# Patient Record
Sex: Female | Born: 1990 | Race: White | Hispanic: No | Marital: Single | Smoking: Current every day smoker
Health system: Southern US, Community
[De-identification: ages and names within clinical notes are randomized; demographics above are authoritative.]

## PROBLEM LIST (undated history)

## (undated) DIAGNOSIS — J9819 Other pulmonary collapse: Secondary | ICD-10-CM

## (undated) DIAGNOSIS — N390 Urinary tract infection, site not specified: Secondary | ICD-10-CM

## (undated) DIAGNOSIS — F1910 Other psychoactive substance abuse, uncomplicated: Secondary | ICD-10-CM

## (undated) DIAGNOSIS — Z8619 Personal history of other infectious and parasitic diseases: Secondary | ICD-10-CM

## (undated) DIAGNOSIS — Z639 Problem related to primary support group, unspecified: Secondary | ICD-10-CM

## (undated) HISTORY — PX: FOOT SURGERY: SHX648

## (undated) HISTORY — DX: Personal history of other infectious and parasitic diseases: Z86.19

## (undated) HISTORY — DX: Urinary tract infection, site not specified: N39.0

---
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
## 2012-06-12 ENCOUNTER — Encounter (HOSPITAL_BASED_OUTPATIENT_CLINIC_OR_DEPARTMENT_OTHER): Payer: Self-pay | Admitting: *Deleted

## 2012-06-12 ENCOUNTER — Emergency Department (HOSPITAL_BASED_OUTPATIENT_CLINIC_OR_DEPARTMENT_OTHER)
Admission: EM | Admit: 2012-06-12 | Discharge: 2012-06-12 | Disposition: A | Discharge status: Home / Self Care | Attending: Emergency Medicine | Admitting: Emergency Medicine

## 2012-06-12 DIAGNOSIS — L0231 Cutaneous abscess of buttock: Secondary | ICD-10-CM | POA: Insufficient documentation

## 2012-06-12 DIAGNOSIS — L0291 Cutaneous abscess, unspecified: Secondary | ICD-10-CM

## 2012-06-12 DIAGNOSIS — F17200 Nicotine dependence, unspecified, uncomplicated: Secondary | ICD-10-CM | POA: Insufficient documentation

## 2012-06-12 MED ORDER — OXYCODONE-ACETAMINOPHEN 5-325 MG PO TABS: 1.0000 | ORAL_TABLET | Freq: Four times a day (QID) | ORAL | Status: DC | PRN

## 2012-06-12 MED ORDER — OXYCODONE-ACETAMINOPHEN 5-325 MG PO TABS
ORAL_TABLET | ORAL | Status: AC
  Administered 2012-06-12: 1 via ORAL
  Filled 2012-06-12: qty 1

## 2012-06-12 MED ORDER — OXYCODONE-ACETAMINOPHEN 5-325 MG PO TABS
1.0000 | ORAL_TABLET | Freq: Once | ORAL | Status: AC
  Administered 2012-06-12: 1 via ORAL
  Filled 2012-06-12: qty 1

## 2012-06-12 MED ORDER — LIDOCAINE-EPINEPHRINE 2 %-1:100000 IJ SOLN
20.0000 mL | Freq: Once | INTRAMUSCULAR | Status: AC
  Administered 2012-06-12: 1 mL via INTRADERMAL

## 2012-06-12 MED ORDER — CLINDAMYCIN HCL 150 MG PO CAPS: 150.0000 mg | ORAL_CAPSULE | Freq: Four times a day (QID) | ORAL | Status: DC

## 2012-06-12 MED ORDER — LIDOCAINE-EPINEPHRINE 2 %-1:100000 IJ SOLN
INTRAMUSCULAR | Status: AC
  Administered 2012-06-12: 1 mL via INTRADERMAL
  Filled 2012-06-12: qty 1

## 2012-06-12 NOTE — ED Provider Notes (Addendum)
History     CSN: [id number]  Arrival date & time 06/12/12  2009   First MD Initiated Contact with Patient 06/12/12 2153      Chief Complaint  Patient presents with  . Abscess    (Consider location/radiation/quality/duration/timing/severity/associated sxs/prior treatment) HPI Pt presents with c/o buttock abscess.  She noted a painful area approx 2 days ago that has been becoming larger and more painful.  Pain is worse with sitting.  She tried to lance the area herself with a sewing needle and blood drained out.  No pain with bowel movements.  No fever, no vomiting.  Has not had hx of abscess.  Has noted small red areas on her buttocks in several other areas as well.  Nothing seems to make pain better.   There are no other associated systemic symptoms, there are no other alleviating or modifying factors.   Past Medical History  Diagnosis Date  . Urinary tract infection, site not specified   . History of chicken pox   . Asthma   . Collapsed lung   . PP care - s/p SVD 1/20 08/13/2011  . Family dysfunction 08/13/2011    Past Surgical History  Procedure Date  . Foot surgery     Family History  Problem Relation Age of Onset  . Diabetes Maternal Grandmother   . Hypertension Maternal Grandmother     History  Substance Use Topics  . Smoking status: Current Every Day Smoker -- 0.2 packs/day for 4 years    Types: Cigarettes  . Smokeless tobacco: Never Used  . Alcohol Use: No    OB History    Grav Para Term Preterm Abortions TAB SAB Ect Mult Living   1 1 1       1       Review of Systems ROS reviewed and all otherwise negative except for mentioned in HPI  Allergies  Review of patient's allergies indicates no known allergies.  Home Medications   Current Outpatient Rx  Name  Route  Sig  Dispense  Refill  . CLINDAMYCIN HCL 150 MG PO CAPS   Oral   Take 1 capsule (150 mg total) by mouth every 6 (six) hours.   28 capsule   0   . OXYCODONE-ACETAMINOPHEN 5-325 MG PO  TABS   Oral   Take 1-2 tablets by mouth every 6 (six) hours as needed for pain.   15 tablet   0   . PRENATAL MULTIVITAMIN CH   Oral   Take 1 tablet by mouth daily.           BP 124/77  Pulse 87  Temp 98.7 F (37.1 C) (Oral)  Resp 20  SpO2 100%  LMP 05/23/2012 Vitals reviewed Physical Exam Physical Examination: General appearance - alert, well appearing, and in no distress Mental status - alert, oriented to person, place, and time Chest - clear to auscultation, no wheezes, rales or rhonchi, symmetric air entry Heart - normal rate, regular rhythm, normal S1, S2, no murmurs, rubs, clicks or gallops Rectal - normal rectal, no masses Extremities - peripheral pulses normal, no pedal edema, no clubbing or cyanosis Skin - normal coloration and turgor, approx 3cm area of induration and tenderness on upper right buttock near gluteal cleft, mild overlying erythema  ED Course  Procedures (including critical care time)  INCISION AND DRAINAGE Performed by: Ethelda Chick Consent: Verbal consent obtained. Risks and benefits: risks, benefits and alternatives were discussed Type: abscess  Body area: right buttoc  Anesthesia: local infiltration  Incision was made with a scalpel.  Local anesthetic: lidocaine 2% w epinephrine  Anesthetic total: 5 ml  Complexity: complex Blunt dissection to break up loculations with hemostat  Drainage: purulent  Drainage amount: scant  Packing material: 1/4 in iodoform gauze  Patient tolerance: Patient tolerated the procedure well with no immediate complications.     Labs Reviewed - No data to display No results found.   1. Abscess       MDM  Pt presenting with buttock abscess- close to gluteal cleft but somewhat off midline, may represent pilonidal abscess.  Scant drainage on I and D but large pocket in subcutanous tissue.  Packing placed and abx started due to concern for cellulitis of surrounding tissue.  Discharged with  strict return precautions.  Pt agreeable with plan.        Ethelda Chick, MD 06/12/12 1610  Ethelda Chick, MD 06/24/12 [phone]

## 2012-06-12 NOTE — ED Notes (Signed)
MD at bedside. 

## 2012-06-12 NOTE — ED Notes (Signed)
Abscess to her buttocks since yesterday.

## 2012-06-16 ENCOUNTER — Emergency Department (HOSPITAL_BASED_OUTPATIENT_CLINIC_OR_DEPARTMENT_OTHER)
Admission: EM | Admit: 2012-06-16 | Discharge: 2012-06-16 | Disposition: A | Discharge status: Home / Self Care | Attending: Emergency Medicine | Admitting: Emergency Medicine

## 2012-06-16 ENCOUNTER — Encounter (HOSPITAL_BASED_OUTPATIENT_CLINIC_OR_DEPARTMENT_OTHER): Payer: Self-pay | Admitting: *Deleted

## 2012-06-16 DIAGNOSIS — Z8619 Personal history of other infectious and parasitic diseases: Secondary | ICD-10-CM | POA: Insufficient documentation

## 2012-06-16 DIAGNOSIS — J45909 Unspecified asthma, uncomplicated: Secondary | ICD-10-CM | POA: Insufficient documentation

## 2012-06-16 DIAGNOSIS — Z8709 Personal history of other diseases of the respiratory system: Secondary | ICD-10-CM | POA: Insufficient documentation

## 2012-06-16 DIAGNOSIS — Z5189 Encounter for other specified aftercare: Secondary | ICD-10-CM

## 2012-06-16 DIAGNOSIS — F17200 Nicotine dependence, unspecified, uncomplicated: Secondary | ICD-10-CM | POA: Insufficient documentation

## 2012-06-16 DIAGNOSIS — Z48817 Encounter for surgical aftercare following surgery on the skin and subcutaneous tissue: Secondary | ICD-10-CM | POA: Insufficient documentation

## 2012-06-16 DIAGNOSIS — Z87440 Personal history of urinary (tract) infections: Secondary | ICD-10-CM | POA: Insufficient documentation

## 2012-06-16 NOTE — ED Provider Notes (Signed)
History  This chart was scribed for Glynn Octave, MD by Ardeen Jourdain, ED Scribe. This patient was seen in room MH12/MH12 and the patient's care was started at 1632.  CSN: [id number]  Arrival date & time 06/16/12  1624   First MD Initiated Contact with Patient 06/16/12 1632      Chief Complaint  Patient presents with  . Wound Check     The history is provided by the patient. No language interpreter was used.    Alicia Ellis is a 21 y.o. female who presents to the Emergency Department complaining of needing an abscess rechecked. She states that the abscess was drained here at [facility] the [facility]. She reports the packing fell out 2 days ago. She states the area is sore, itchy and painful at times. She denies fever, nausea, emesis, abnormal drainage from the site and abdominal pain as associated symptoms. She denies any history of these conditions. She has a h/o UTI, asthma and collapsed lung. She is a current everyday smoker but denies alcohol use.   Past Medical History  Diagnosis Date  . Urinary tract infection, site not specified   . History of chicken pox   . Asthma   . Collapsed lung   . PP care - s/p SVD 1/20 08/13/2011  . Family dysfunction 08/13/2011    Past Surgical History  Procedure Date  . Foot surgery     Family History  Problem Relation Age of Onset  . Diabetes Maternal Grandmother   . Hypertension Maternal Grandmother     History  Substance Use Topics  . Smoking status: Current Every Day Smoker -- 0.2 packs/day for 4 years    Types: Cigarettes  . Smokeless tobacco: Never Used  . Alcohol Use: No    OB History    Grav Para Term Preterm Abortions TAB SAB Ect Mult Living   1 1 1       1       Review of Systems  All other systems reviewed and are negative.  A complete 10 system review of systems was obtained and all systems are negative except as noted in the HPI and PMH.   Allergies  Review of patient's allergies indicates no known allergies.  Home  Medications   Current Outpatient Rx  Name  Route  Sig  Dispense  Refill  . CLINDAMYCIN HCL 150 MG PO CAPS   Oral   Take 1 capsule (150 mg total) by mouth every 6 (six) hours.   28 capsule   0   . OXYCODONE-ACETAMINOPHEN 5-325 MG PO TABS   Oral   Take 1-2 tablets by mouth every 6 (six) hours as needed for pain.   15 tablet   0   . PRENATAL MULTIVITAMIN CH   Oral   Take 1 tablet by mouth daily.           Triage Vitals: BP 117/77  Pulse 108  Temp 97.9 F (36.6 C) (Oral)  Resp 18  Ht 5\' 3"  (1.6 m)  Wt 135 lb (61.236 kg)  BMI 23.91 kg/m2  SpO2 100%  LMP 05/23/2012  Physical Exam  Nursing note and vitals reviewed. Constitutional: She is oriented to person, place, and time. She appears well-developed and well-nourished. No distress.  HENT:  Head: Normocephalic and atraumatic.  Eyes: EOM are normal. Pupils are equal, round, and reactive to light.  Neck: Normal range of motion. Neck supple. No tracheal deviation present.  Cardiovascular: Normal rate, regular rhythm and normal heart sounds.  Pulmonary/Chest: Effort normal and breath sounds normal. No respiratory distress.  Abdominal: Soft. She exhibits no distension.  Musculoskeletal: Normal range of motion. She exhibits no edema.  Neurological: She is alert and oriented to person, place, and time.  Skin: Skin is warm and dry.       Right buttock superior cleft open incision with surrounding induration of 2 cm. No drainage. No surrounding cellulitis   Psychiatric: She has a normal mood and affect. Her behavior is normal.    ED Course  INCISION AND DRAINAGE Date/Time: 06/16/2012 5:01 PM Performed by: Glynn Octave Authorized by: Glynn Octave Consent: Verbal consent obtained. Risks and benefits: risks, benefits and alternatives were discussed Consent given by: patient Patient understanding: patient states understanding of the procedure being performed Patient identity confirmed: verbally with patient Type:  pilonidal cyst Body area: anogenital Location details: pilonidal Anesthesia: local infiltration Local anesthetic: lidocaine 1% without epinephrine Anesthetic total: 5 ml Patient sedated: no Scalpel size: 11 Incision type: single straight Complexity: complex Drainage: bloody Drainage amount: scant Wound treatment: wound left open Packing material: 1/4 in iodoform gauze Patient tolerance: Patient tolerated the procedure well with no immediate complications.   (including critical care time)  DIAGNOSTIC STUDIES: Oxygen Saturation is 100% on normal,  by my interpretation.    COORDINATION OF CARE:  4:47 PM: Discussed treatment plan which includes an I&D with pt at bedside and pt agreed to plan.    Labs Reviewed - No data to display No results found.   No diagnosis found.    MDM  Recheck of R buttock abscess.  Open incision with surrounding induration. No fluctuance or cellulitis. No fever or vomiting.  Chaperone Tresa Endo present for exam and procedure. Wound reexplored.  No further purulence.  Patient states induration is improving.  Continue antibiotics. Follow up with PCP. May need evaluation for pilonidal cyst when infection clears.    I personally performed the services described in this documentation, which was scribed in my presence. The recorded information has been reviewed and is accurate.    Glynn Octave, MD 06/16/12 1714

## 2012-06-16 NOTE — ED Notes (Addendum)
[facility] here looking for patient. Pt was discharged prior to [facility] arrival. [facility] requests that if patient checks in again to be called immediately.

## 2012-06-16 NOTE — ED Notes (Signed)
Pt last seen 21st  , returned today for abscess recheck , pt states packing came out x 2 days ago

## 2012-09-11 ENCOUNTER — Emergency Department (HOSPITAL_BASED_OUTPATIENT_CLINIC_OR_DEPARTMENT_OTHER)
Admission: EM | Admit: 2012-09-11 | Discharge: 2012-09-11 | Disposition: A | Discharge status: Home / Self Care | Attending: Emergency Medicine | Admitting: Emergency Medicine

## 2012-09-11 ENCOUNTER — Encounter (HOSPITAL_BASED_OUTPATIENT_CLINIC_OR_DEPARTMENT_OTHER): Payer: Self-pay | Admitting: *Deleted

## 2012-09-11 DIAGNOSIS — Z639 Problem related to primary support group, unspecified: Secondary | ICD-10-CM | POA: Insufficient documentation

## 2012-09-11 DIAGNOSIS — Z8619 Personal history of other infectious and parasitic diseases: Secondary | ICD-10-CM | POA: Insufficient documentation

## 2012-09-11 DIAGNOSIS — Z87440 Personal history of urinary (tract) infections: Secondary | ICD-10-CM | POA: Insufficient documentation

## 2012-09-11 DIAGNOSIS — N764 Abscess of vulva: Secondary | ICD-10-CM | POA: Insufficient documentation

## 2012-09-11 DIAGNOSIS — Z8709 Personal history of other diseases of the respiratory system: Secondary | ICD-10-CM | POA: Insufficient documentation

## 2012-09-11 DIAGNOSIS — L0291 Cutaneous abscess, unspecified: Secondary | ICD-10-CM

## 2012-09-11 DIAGNOSIS — F17200 Nicotine dependence, unspecified, uncomplicated: Secondary | ICD-10-CM | POA: Insufficient documentation

## 2012-09-11 DIAGNOSIS — J45909 Unspecified asthma, uncomplicated: Secondary | ICD-10-CM | POA: Insufficient documentation

## 2012-09-11 NOTE — ED Notes (Signed)
States she has an abcess on her vagina

## 2012-09-11 NOTE — ED Notes (Signed)
MD at bedside. 

## 2012-09-11 NOTE — ED Provider Notes (Signed)
History     CSN: [id number]  Arrival date & time 09/11/12  3086   First MD Initiated Contact with Patient 09/11/12 [phone]      Chief Complaint  Patient presents with  . Recurrent Skin Infections    (Consider location/radiation/quality/duration/timing/severity/associated sxs/prior treatment) HPI Alicia Ellis is a 22 y.o. female presenting with an abscess to her mons pubis. Patient says this started as a small pimple and has grown over the course of the last 2-3 days has become more and more painful. Pain is currently severe when touched or when any thing brushes the area. She says there is no surrounding area of redness and she's had no fevers, chills, abdominal pain, vaginal discharge, dysuria, frequency, chest pain or shortness of breath. She has tried no remedies.   Past Medical History  Diagnosis Date  . Urinary tract infection, site not specified   . History of chicken pox   . Asthma   . Collapsed lung   . PP care - s/p SVD 1/20 08/13/2011  . Family dysfunction 08/13/2011    Past Surgical History  Procedure Laterality Date  . Foot surgery      Family History  Problem Relation Age of Onset  . Diabetes Maternal Grandmother   . Hypertension Maternal Grandmother     History  Substance Use Topics  . Smoking status: Current Every Day Smoker -- 0.25 packs/day for 4 years    Types: Cigarettes  . Smokeless tobacco: Never Used  . Alcohol Use: No    OB History   Grav Para Term Preterm Abortions TAB SAB Ect Mult Living   1 1 1       1       Review of Systems At least 10pt or greater review of systems completed and are negative except where specified in the HPI.  Allergies  Review of patient's allergies indicates no known allergies.  Home Medications   Current Outpatient Rx  Name  Route  Sig  Dispense  Refill  . clindamycin (CLEOCIN) 150 MG capsule   Oral   Take 1 capsule (150 mg total) by mouth every 6 (six) hours.   28 capsule   0   . oxyCODONE-acetaminophen  (PERCOCET/ROXICET) 5-325 MG per tablet   Oral   Take 1-2 tablets by mouth every 6 (six) hours as needed for pain.   15 tablet   0   . Prenatal Vit-Fe Fumarate-FA (PRENATAL MULTIVITAMIN) TABS   Oral   Take 1 tablet by mouth daily.           BP 119/58  Pulse 100  Temp(Src) 98.7 F (37.1 C) (Oral)  Resp 16  SpO2 100%  Physical Exam Genital exam and incision and drainage performed with female chaperone  Nursing notes reviewed.  Electronic medical record reviewed. VITAL SIGNS:   Filed Vitals:   09/11/12 0639  BP: 119/58  Pulse: 100  Temp: 98.7 F (37.1 C)  TempSrc: Oral  Resp: 16  SpO2: 100%   CONSTITUTIONAL: Awake, oriented x4, appears non-toxic, smells of cigarette smoke HENT: Atraumatic, normocephalic, oral mucosa pink and moist, airway patent. Nares patent without drainage. External ears normal. EYES: Conjunctiva clear, EOMI, PERRLA NECK: Trachea midline, non-tender, supple CARDIOVASCULAR: Normal heart rate, Normal rhythm, No murmurs, rubs, gallops PULMONARY/CHEST: Clear to auscultation, no rhonchi, wheezes, or rales. Symmetrical breath sounds. Non-tender. ABDOMINAL: Non-distended, soft, non-tender - no rebound or guarding.  BS normal. NEUROLOGIC: Non-focal, moving all four extremities, no gross sensory or motor deficits. EXTERNAL GENITAL: Normal female labia.  2cm area of erythema/induration with central fluctuance on left mons pubis. EXTREMITIES: No clubbing, cyanosis, or edema SKIN: Warm, Dry, No erythema, No rash  ED Course  INCISION AND DRAINAGE Date/Time: 09/11/2012 7:17 AM Performed by: Jones Skene Authorized by: Jones Skene Consent: Verbal consent obtained. Risks and benefits: risks, benefits and alternatives were discussed Consent given by: patient Patient identity confirmed: verbally with patient Type: abscess Body area: anogenital (mons pubis) Local anesthetic: lidocaine 2% with epinephrine Anesthetic total: 5 ml Patient sedated:  no Scalpel size: 11 Incision type: single straight Complexity: simple Drainage: purulent Drainage amount: moderate Wound treatment: wound left open Patient tolerance: Patient tolerated the procedure well with no immediate complications.   (including critical care time)  Labs Reviewed - No data to display No results found.   1. Abscess       MDM  Alicia Ellis is a 22 y.o. female presents a small abscess to the mons pubis. Area was I&D with good result, small loculations broken up with a curved mosquito, no immediate complications.  There is no surrounding erythema to suggest cellulitis, Robaxin not indicated at this time, patient is up-to-date on vaccinations.  I explained the diagnosis and have given explicit precautions to return to the ER including redness, swelling, draining pus, fever, chills, abdominal or any other new or worsening symptoms. The patient understands and accepts the medical plan as it's been dictated and I have answered their questions. Discharge instructions concerning home care and prescriptions have been given.  The patient is STABLE and is discharged to home in good condition.         Jones Skene, MD 09/11/12 [phone]

## 2013-02-02 ENCOUNTER — Encounter (HOSPITAL_BASED_OUTPATIENT_CLINIC_OR_DEPARTMENT_OTHER): Payer: Self-pay | Admitting: Emergency Medicine

## 2013-02-02 ENCOUNTER — Emergency Department (HOSPITAL_BASED_OUTPATIENT_CLINIC_OR_DEPARTMENT_OTHER)
Admission: EM | Admit: 2013-02-02 | Discharge: 2013-02-02 | Disposition: A | Discharge status: Home / Self Care | Attending: Emergency Medicine | Admitting: Emergency Medicine

## 2013-02-02 DIAGNOSIS — Z79899 Other long term (current) drug therapy: Secondary | ICD-10-CM | POA: Insufficient documentation

## 2013-02-02 DIAGNOSIS — Z8619 Personal history of other infectious and parasitic diseases: Secondary | ICD-10-CM | POA: Insufficient documentation

## 2013-02-02 DIAGNOSIS — F17200 Nicotine dependence, unspecified, uncomplicated: Secondary | ICD-10-CM | POA: Insufficient documentation

## 2013-02-02 DIAGNOSIS — Z3202 Encounter for pregnancy test, result negative: Secondary | ICD-10-CM | POA: Insufficient documentation

## 2013-02-02 DIAGNOSIS — F1910 Other psychoactive substance abuse, uncomplicated: Secondary | ICD-10-CM

## 2013-02-02 DIAGNOSIS — Z87440 Personal history of urinary (tract) infections: Secondary | ICD-10-CM | POA: Insufficient documentation

## 2013-02-02 DIAGNOSIS — F1210 Cannabis abuse, uncomplicated: Secondary | ICD-10-CM | POA: Insufficient documentation

## 2013-02-02 DIAGNOSIS — Z8709 Personal history of other diseases of the respiratory system: Secondary | ICD-10-CM | POA: Insufficient documentation

## 2013-02-02 DIAGNOSIS — J45909 Unspecified asthma, uncomplicated: Secondary | ICD-10-CM | POA: Insufficient documentation

## 2013-02-02 DIAGNOSIS — F1010 Alcohol abuse, uncomplicated: Secondary | ICD-10-CM | POA: Insufficient documentation

## 2013-02-02 LAB — RAPID URINE DRUG SCREEN, HOSP PERFORMED
Cocaine: NOT DETECTED
Opiates: NOT DETECTED

## 2013-02-02 LAB — COMPREHENSIVE METABOLIC PANEL
AST: 16 U/L (ref 0–37)
Albumin: 4 g/dL (ref 3.5–5.2)
BUN: 9 mg/dL (ref 6–23)
CO2: 23 mEq/L (ref 19–32)
Calcium: 9.7 mg/dL (ref 8.4–10.5)
Creatinine, Ser: 0.7 mg/dL (ref 0.50–1.10)
GFR calc non Af Amer: >90 mL/min (ref >90)
Total Bilirubin: 0.5 mg/dL (ref 0.3–1.2)

## 2013-02-02 LAB — PREGNANCY, URINE: Preg Test, Ur: NEGATIVE

## 2013-02-02 LAB — URINALYSIS, ROUTINE W REFLEX MICROSCOPIC
Hgb urine dipstick: NEGATIVE
Nitrite: NEGATIVE
Specific Gravity, Urine: 1.028 (ref 1.005–1.030)
Urobilinogen, UA: 1 mg/dL (ref 0.0–1.0)

## 2013-02-02 LAB — URINE MICROSCOPIC-ADD ON

## 2013-02-02 LAB — CBC
HCT: 37.6 % (ref 36.0–46.0)
MCH: 29.9 pg (ref 26.0–34.0)
MCV: 90.6 fL (ref 78.0–100.0)
Platelets: 222 10*3/uL (ref 150–400)
RDW: 14.9 % (ref 11.5–15.5)

## 2013-02-02 LAB — ETHANOL: Alcohol, Ethyl (B): <11 mg/dL (ref 0–11)

## 2013-02-02 NOTE — ED Provider Notes (Signed)
History    CSN: [id number] Arrival date & time 02/02/13  2107  First MD Initiated Contact with Patient 02/02/13 2137     Chief Complaint  Patient presents with  . Medical Clearance   (Consider location/radiation/quality/duration/timing/severity/associated sxs/prior Treatment) Patient is a 22 y.o. female presenting with drug/alcohol assessment. The history is provided by the patient. No language interpreter was used.  Drug / Alcohol Assessment Similar prior episodes: yes   Severity:  Severe Onset quality:  Unable to specify Timing:  Constant Progression:  Worsening Suspected agents:  Alcohol and marijuana Associated symptoms: no suicidal ideation   Risk factors: no withdrawal syndrome   Pt reports she smokes marijuana multiple times daily.  Pt reports she drinks as much alcohol as she can get.   Pt has used multiple other drugs but not recently.   Pt reports DSS told her she needs to be in a treatment program. For detox.   Pt has an assessment at [facility] on Wednesday for long term treatment.   Pt is not suicidal.   Pt is not homicidal.   Past Medical History  Diagnosis Date  . Urinary tract infection, site not specified   . History of chicken pox   . Asthma   . Collapsed lung   . PP care - s/p SVD 1/20 08/13/2011  . Family dysfunction 08/13/2011   Past Surgical History  Procedure Laterality Date  . Foot surgery     Family History  Problem Relation Age of Onset  . Diabetes Maternal Grandmother   . Hypertension Maternal Grandmother    History  Substance Use Topics  . Smoking status: Current Every Day Smoker -- 0.25 packs/day for 4 years    Types: Cigarettes  . Smokeless tobacco: Never Used  . Alcohol Use: 14.4 oz/week    24 Cans of beer per week     Comment: last drink this morning   OB History   Grav Para Term Preterm Abortions TAB SAB Ect Mult Living   1 1 1       1      Review of Systems  Psychiatric/Behavioral: Negative for suicidal ideas, self-injury and  dysphoric mood.  All other systems reviewed and are negative.    Allergies  Review of patient's allergies indicates no known allergies.  Home Medications   Current Outpatient Rx  Name  Route  Sig  Dispense  Refill  . clindamycin (CLEOCIN) 150 MG capsule   Oral   Take 1 capsule (150 mg total) by mouth every 6 (six) hours.   28 capsule   0   . oxyCODONE-acetaminophen (PERCOCET/ROXICET) 5-325 MG per tablet   Oral   Take 1-2 tablets by mouth every 6 (six) hours as needed for pain.   15 tablet   0   . Prenatal Vit-Fe Fumarate-FA (PRENATAL MULTIVITAMIN) TABS   Oral   Take 1 tablet by mouth daily.          BP 131/69  Pulse 81  Temp(Src) 99 F (37.2 C) (Oral)  Resp 18  Ht 5\' 3"  (1.6 m)  Wt 138 lb (62.596 kg)  BMI 24.45 kg/m2  SpO2 99% Physical Exam  Nursing note and vitals reviewed. Constitutional: She is oriented to person, place, and time. She appears well-developed and well-nourished.  HENT:  Head: Normocephalic.  Right Ear: External ear normal.  Left Ear: External ear normal.  Nose: Nose normal.  Mouth/Throat: Oropharynx is clear and moist.  Eyes: Conjunctivae and EOM are normal. Pupils are equal, round, and  reactive to light.  Neck: Normal range of motion. Neck supple.  Cardiovascular: Normal rate.   Pulmonary/Chest: Effort normal.  Abdominal: Soft.  Musculoskeletal: Normal range of motion.  Neurological: She is alert and oriented to person, place, and time. She has normal reflexes.  Skin: Skin is warm.  Psychiatric: She has a normal mood and affect.    ED Course  Procedures (including critical care time) Labs Reviewed  URINALYSIS, ROUTINE W REFLEX MICROSCOPIC - Abnormal; Notable for the following:    Color, Urine AMBER (*)    APPearance CLOUDY (*)    Ketones, ur 15 (*)    Protein, ur 100 (*)    Leukocytes, UA SMALL (*)    All other components within normal limits  URINE RAPID DRUG SCREEN (HOSP PERFORMED) - Abnormal; Notable for the following:     Tetrahydrocannabinol POSITIVE (*)    All other components within normal limits  URINE MICROSCOPIC-ADD ON - Abnormal; Notable for the following:    Squamous Epithelial / LPF MANY (*)    Bacteria, UA FEW (*)    All other components within normal limits  URINE CULTURE  PREGNANCY, URINE  CBC  COMPREHENSIVE METABOLIC PANEL  ETHANOL   No results found. 1. Substance abuse     MDM  Marijuana positive,   Labs sent to [facility].   Pt advised to keep appointment.  No signs of DT's  Pt advised to go to [facility] if any problems.  Lonia Skinner [place], PA-C 02/02/13 2324

## 2013-02-02 NOTE — ED Notes (Signed)
Results faxed to [facility] and paperwork and results given to pt in sealed envelope to take to [facility]

## 2013-02-02 NOTE — ED Notes (Signed)
Pt's ride here for transport home, PA made aware. NAD noted at this time, pt denies SI or HI.

## 2013-02-02 NOTE — ED Notes (Signed)
Pt seeking detox from marijuana and etoh.

## 2013-02-03 LAB — URINE CULTURE: Colony Count: 35000

## 2013-02-03 NOTE — ED Provider Notes (Signed)
Medical screening examination/treatment/procedure(s) were performed by non-physician practitioner and as supervising physician I was immediately available for consultation/collaboration.   Seleena Reimers, MD 02/03/13 0826 

## 2014-05-24 ENCOUNTER — Encounter (HOSPITAL_BASED_OUTPATIENT_CLINIC_OR_DEPARTMENT_OTHER): Payer: Self-pay | Admitting: Emergency Medicine

## 2014-06-02 ENCOUNTER — Emergency Department (HOSPITAL_BASED_OUTPATIENT_CLINIC_OR_DEPARTMENT_OTHER)
Admission: EM | Admit: 2014-06-02 | Discharge: 2014-06-02 | Disposition: A | Discharge status: Home / Self Care | Payer: BC Managed Care – PPO | Attending: Emergency Medicine | Admitting: Emergency Medicine

## 2014-06-02 ENCOUNTER — Encounter (HOSPITAL_BASED_OUTPATIENT_CLINIC_OR_DEPARTMENT_OTHER): Payer: Self-pay

## 2014-06-02 ENCOUNTER — Emergency Department (HOSPITAL_BASED_OUTPATIENT_CLINIC_OR_DEPARTMENT_OTHER): Payer: BC Managed Care – PPO

## 2014-06-02 DIAGNOSIS — Z8619 Personal history of other infectious and parasitic diseases: Secondary | ICD-10-CM | POA: Insufficient documentation

## 2014-06-02 DIAGNOSIS — Y9389 Activity, other specified: Secondary | ICD-10-CM | POA: Insufficient documentation

## 2014-06-02 DIAGNOSIS — Z720 Tobacco use: Secondary | ICD-10-CM | POA: Insufficient documentation

## 2014-06-02 DIAGNOSIS — Z87440 Personal history of urinary (tract) infections: Secondary | ICD-10-CM | POA: Diagnosis not present

## 2014-06-02 DIAGNOSIS — Y92009 Unspecified place in unspecified non-institutional (private) residence as the place of occurrence of the external cause: Secondary | ICD-10-CM | POA: Insufficient documentation

## 2014-06-02 DIAGNOSIS — S199XXA Unspecified injury of neck, initial encounter: Secondary | ICD-10-CM | POA: Diagnosis present

## 2014-06-02 DIAGNOSIS — Y998 Other external cause status: Secondary | ICD-10-CM | POA: Insufficient documentation

## 2014-06-02 DIAGNOSIS — J45909 Unspecified asthma, uncomplicated: Secondary | ICD-10-CM | POA: Insufficient documentation

## 2014-06-02 DIAGNOSIS — S161XXA Strain of muscle, fascia and tendon at neck level, initial encounter: Secondary | ICD-10-CM | POA: Insufficient documentation

## 2014-06-02 HISTORY — DX: Other psychoactive substance abuse, uncomplicated: F19.10

## 2014-06-02 MED ORDER — HYDROCODONE-ACETAMINOPHEN 5-325 MG PO TABS: 1.0000 | ORAL_TABLET | Freq: Once | ORAL | Status: DC

## 2014-06-02 MED ORDER — IBUPROFEN 400 MG PO TABS: 600.0000 mg | ORAL_TABLET | Freq: Once | ORAL | Status: DC

## 2014-06-02 MED ORDER — CYCLOBENZAPRINE HCL 10 MG PO TABS
10.0000 mg | ORAL_TABLET | Freq: Once | ORAL | Status: AC
  Administered 2014-06-02: 10 mg via ORAL
  Filled 2014-06-02: qty 1

## 2014-06-02 MED ORDER — KETOROLAC TROMETHAMINE 60 MG/2ML IM SOLN
60.0000 mg | Freq: Once | INTRAMUSCULAR | Status: AC
  Administered 2014-06-02: 60 mg via INTRAMUSCULAR
  Filled 2014-06-02: qty 2

## 2014-06-02 MED ORDER — CYCLOBENZAPRINE HCL 10 MG PO TABS: 10.0000 mg | ORAL_TABLET | Freq: Two times a day (BID) | ORAL | Status: AC | PRN

## 2014-06-02 NOTE — Discharge Instructions (Signed)
Assault, General Assault includes any behavior, whether intentional or reckless, which results in bodily injury to another person and/or damage to property. Included in this would be any behavior, intentional or reckless, that by its nature would be understood (interpreted) by a reasonable person as intent to harm another person or to damage his/her property. Threats may be oral or written. They may be communicated through regular mail, computer, fax, or phone. These threats may be direct or implied. FORMS OF ASSAULT INCLUDE:  Physically assaulting a person. This includes physical threats to inflict physical harm as well as:  Slapping.  Hitting.  Poking.  Kicking.  Punching.  Pushing.  Arson.  Sabotage.  Equipment vandalism.  Damaging or destroying property.  Throwing or hitting objects.  Displaying a weapon or an object that appears to be a weapon in a threatening manner.  Carrying a firearm of any kind.  Using a weapon to harm someone.  Using greater physical size/strength to intimidate another.  Making intimidating or threatening gestures.  Bullying.  Hazing.  Intimidating, threatening, hostile, or abusive language directed toward another person.  It communicates the intention to engage in violence against that person. And it leads a reasonable person to expect that violent behavior may occur.  Stalking another person. IF IT HAPPENS AGAIN:  Immediately call for emergency help (911 in [place]  If someone poses clear and immediate danger to you, seek legal authorities to have a protective or restraining order put in place.  Less threatening assaults can at least be reported to authorities. STEPS TO TAKE IF A SEXUAL ASSAULT HAS HAPPENED  Go to an area of safety. This may include a shelter or staying with a friend. Stay away from the area where you have been attacked. A large percentage of sexual assaults are caused by a friend, relative or associate.  If  medications were given by your caregiver, take them as directed for the full length of time prescribed.  Only take over-the-counter or prescription medicines for pain, discomfort, or fever as directed by your caregiver.  If you have come in contact with a sexual disease, find out if you are to be tested again. If your caregiver is concerned about the HIV/AIDS virus, he/she may require you to have continued testing for several months.  For the protection of your privacy, test results can not be given over the phone. Make sure you receive the results of your test. If your test results are not back during your visit, make an appointment with your caregiver to find out the results. Do not assume everything is normal if you have not heard from your caregiver or the medical facility. It is important for you to follow up on all of your test results.  File appropriate papers with authorities. This is important in all assaults, even if it has occurred in a family or by a friend. SEEK MEDICAL CARE IF:  You have new problems because of your injuries.  You have problems that may be because of the medicine you are taking, such as:  Rash.  Itching.  Swelling.  Trouble breathing.  You develop belly (abdominal) pain, feel sick to your stomach (nausea) or are vomiting.  You begin to run a temperature.  You need supportive care or referral to a rape crisis center. These are centers with trained personnel who can help you get through this ordeal. SEEK IMMEDIATE MEDICAL CARE IF:  You are afraid of being threatened, beaten, or abused. In [place] call 911.  You  receive new injuries related to abuse.  You develop severe pain in any area injured in the assault or have any change in your condition that concerns you.  You faint or lose consciousness.  You develop chest pain or shortness of breath. Document Released: 07/09/2005 Document Revised: 10/01/2011 Document Reviewed: 02/25/2008 [facility] Patient  Information 2015 [place], [place]. This information is not intended to replace advice given to you by your health care provider. Make sure you discuss any questions you have with your health care provider.  Cervical Sprain A cervical sprain is when the tissues (ligaments) that hold the neck bones in place stretch or tear. HOME CARE   Put ice on the injured area.  Put ice in a plastic bag.  Place a towel between your skin and the bag.  Leave the ice on for 15-20 minutes, 3-4 times a day.  You may have been given a collar to wear. This collar keeps your neck from moving while you heal.  Do not take the collar off unless told by your doctor.  If you have long hair, keep it outside of the collar.  Ask your doctor before changing the position of your collar. You may need to change its position over time to make it more comfortable.  If you are allowed to take off the collar for cleaning or bathing, follow your doctor's instructions on how to do it safely.  Keep your collar clean by wiping it with mild soap and water. Dry it completely. If the collar has removable pads, remove them every 1-2 days to hand wash them with soap and water. Allow them to air dry. They should be dry before you wear them in the collar.  Do not drive while wearing the collar.  Only take medicine as told by your doctor.  Keep all doctor visits as told.  Keep all physical therapy visits as told.  Adjust your work station so that you have good posture while you work.  Avoid positions and activities that make your problems worse.  Warm up and stretch before being active. GET HELP IF:  Your pain is not controlled with medicine.  You cannot take less pain medicine over time as planned.  Your activity level does not improve as expected. GET HELP RIGHT AWAY IF:   You are bleeding.  Your stomach is upset.  You have an allergic reaction to your medicine.  You develop new problems that you cannot  explain.  You lose feeling (become numb) or you cannot move any part of your body (paralysis).  You have tingling or weakness in any part of your body.  Your symptoms get worse. Symptoms include:  Pain, soreness, stiffness, puffiness (swelling), or a burning feeling in your neck.  Pain when your neck is touched.  Shoulder or upper back pain.  Limited ability to move your neck.  Headache.  Dizziness.  Your hands or arms feel week, lose feeling, or tingle.  Muscle spasms.  Difficulty swallowing or chewing. MAKE SURE YOU:   Understand these instructions.  Will watch your condition.  Will get help right away if you are not doing well or get worse. Document Released: 12/26/2007 Document Revised: 03/11/2013 Document Reviewed: 01/14/2013 [facility] Patient Information 2015 [place], [place]. This information is not intended to replace advice given to you by your health care provider. Make sure you discuss any questions you have with your health care provider.
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
